# Patient Record
Sex: Female | Born: 1975 | Hispanic: Yes | Marital: Single
Health system: Southern US, Community
[De-identification: ages and names within clinical notes are randomized; demographics above are authoritative.]

---

## 2007-06-24 ENCOUNTER — Encounter: Payer: Self-pay | Admitting: Maternal & Fetal Medicine

## 2007-07-08 ENCOUNTER — Encounter: Payer: Self-pay | Admitting: Maternal & Fetal Medicine

## 2007-07-22 ENCOUNTER — Encounter: Payer: Self-pay | Admitting: Maternal & Fetal Medicine

## 2007-09-02 ENCOUNTER — Encounter: Payer: Self-pay | Admitting: Maternal & Fetal Medicine

## 2007-10-07 ENCOUNTER — Encounter: Payer: Self-pay | Admitting: Maternal & Fetal Medicine

## 2007-12-01 ENCOUNTER — Observation Stay: Payer: Self-pay

## 2007-12-16 ENCOUNTER — Inpatient Hospital Stay: Payer: Self-pay

## 2012-03-22 ENCOUNTER — Ambulatory Visit: Payer: Self-pay | Admitting: Family Medicine

## 2012-03-31 ENCOUNTER — Emergency Department: Payer: Self-pay | Admitting: *Deleted

## 2012-03-31 LAB — URINALYSIS, COMPLETE
Bacteria: NONE SEEN
Bilirubin,UR: NEGATIVE
Ketone: NEGATIVE
Ph: 7 (ref 4.5–8.0)
Protein: 30
RBC,UR: 68 /HPF (ref 0–5)
Specific Gravity: 1.003 (ref 1.003–1.030)
Squamous Epithelial: 1

## 2012-03-31 LAB — HCG, QUANTITATIVE, PREGNANCY: Beta Hcg, Quant.: 4525 m[IU]/mL — ABNORMAL HIGH

## 2012-03-31 LAB — CBC
HGB: 13.2 g/dL (ref 12.0–16.0)
MCH: 28.9 pg (ref 26.0–34.0)
MCHC: 33.7 g/dL (ref 32.0–36.0)
MCV: 86 fL (ref 80–100)
Platelet: 205 10*3/uL (ref 150–440)
RDW: 13.7 % (ref 11.5–14.5)

## 2012-04-03 LAB — PATHOLOGY REPORT

## 2012-06-20 ENCOUNTER — Encounter: Payer: Self-pay | Admitting: Maternal and Fetal Medicine

## 2012-07-04 ENCOUNTER — Encounter: Payer: Self-pay | Admitting: Obstetrics and Gynecology

## 2012-07-25 ENCOUNTER — Encounter: Payer: Self-pay | Admitting: Obstetrics & Gynecology

## 2012-08-29 ENCOUNTER — Encounter: Payer: Self-pay | Admitting: Obstetrics & Gynecology

## 2012-11-07 ENCOUNTER — Encounter: Payer: Self-pay | Admitting: Obstetrics and Gynecology

## 2013-01-29 ENCOUNTER — Inpatient Hospital Stay: Payer: Self-pay | Admitting: Obstetrics and Gynecology

## 2013-01-29 LAB — CBC WITH DIFFERENTIAL/PLATELET
Basophil #: 0 10*3/uL (ref 0.0–0.1)
Basophil %: 0.3 %
Eosinophil %: 0.2 %
Lymphocyte #: 1.3 10*3/uL (ref 1.0–3.6)
Lymphocyte %: 19.9 %
MCH: 28.9 pg (ref 26.0–34.0)
MCV: 84 fL (ref 80–100)
Monocyte #: 0.5 x10 3/mm (ref 0.2–0.9)
Monocyte %: 6.9 %
Neutrophil #: 4.9 10*3/uL (ref 1.4–6.5)
Platelet: 190 10*3/uL (ref 150–440)
RBC: 4.14 10*6/uL (ref 3.80–5.20)
RDW: 13.5 % (ref 11.5–14.5)
WBC: 6.8 10*3/uL (ref 3.6–11.0)

## 2013-01-30 LAB — CBC WITH DIFFERENTIAL/PLATELET
Basophil %: 0.2 %
Eosinophil %: 0.1 %
HCT: 33.8 % — ABNORMAL LOW (ref 35.0–47.0)
HGB: 11.8 g/dL — ABNORMAL LOW (ref 12.0–16.0)
Lymphocyte #: 1.4 10*3/uL (ref 1.0–3.6)
MCHC: 34.8 g/dL (ref 32.0–36.0)
MCV: 84 fL (ref 80–100)
Monocyte #: 0.7 x10 3/mm (ref 0.2–0.9)
Monocyte %: 6.6 %
Platelet: 199 10*3/uL (ref 150–440)

## 2013-01-30 LAB — HEMATOCRIT: HCT: 34.1 % — ABNORMAL LOW (ref 35.0–47.0)

## 2013-07-08 IMAGING — US US OB NUCHAL TRANSLUCENCY 1ST GEST - [facility] NRPT
1 series · 14 of 28 positions shown · non-contrast
Comparison: none

[Series 1: us ob nuchal translucency 1st gest - [facility] nrpt · 0.18mm/px · 14 of 47 slices shown]
[im 2/47]
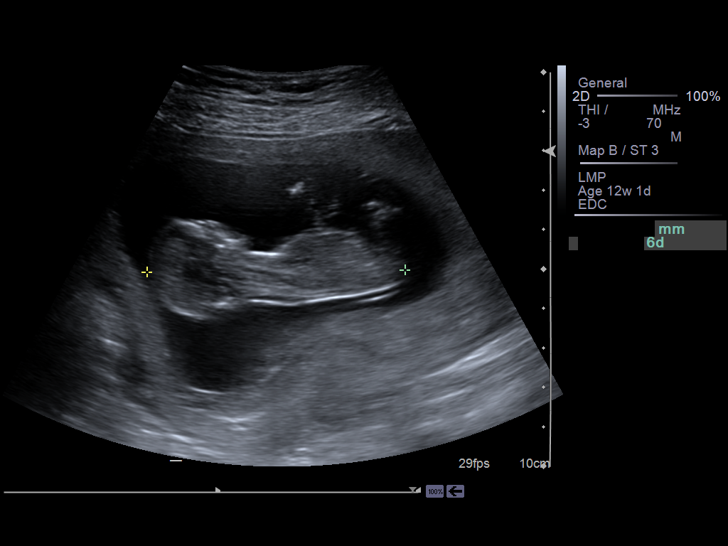
[im 6/47]
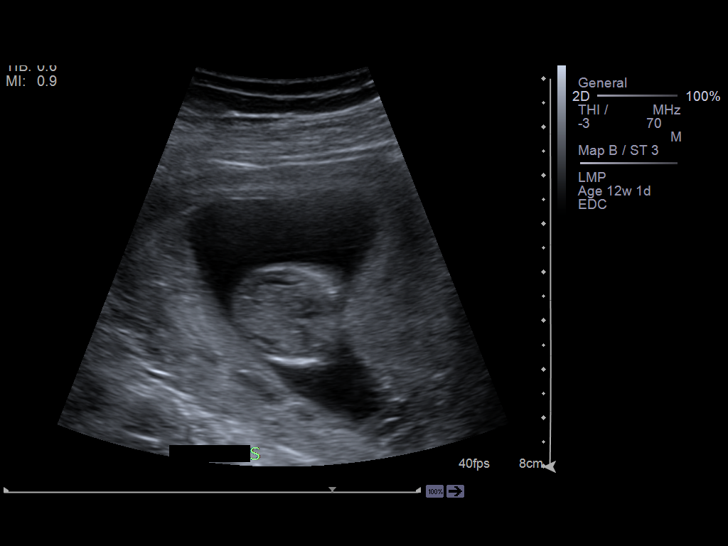
[im 9/47]
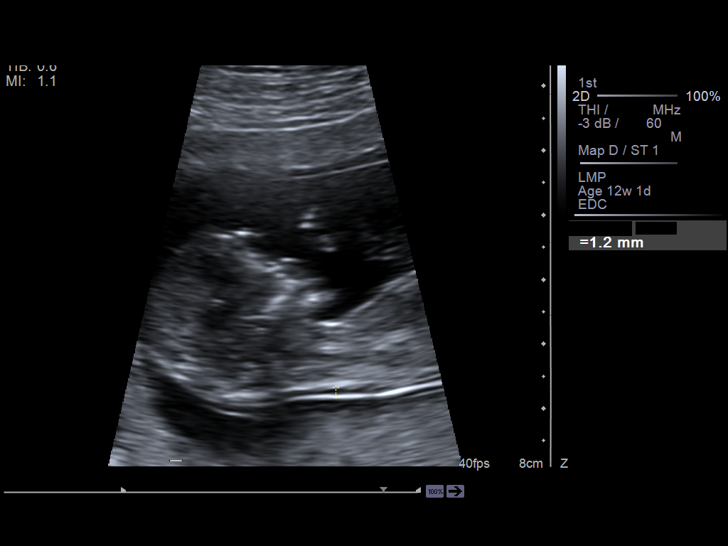
[im 12/47]
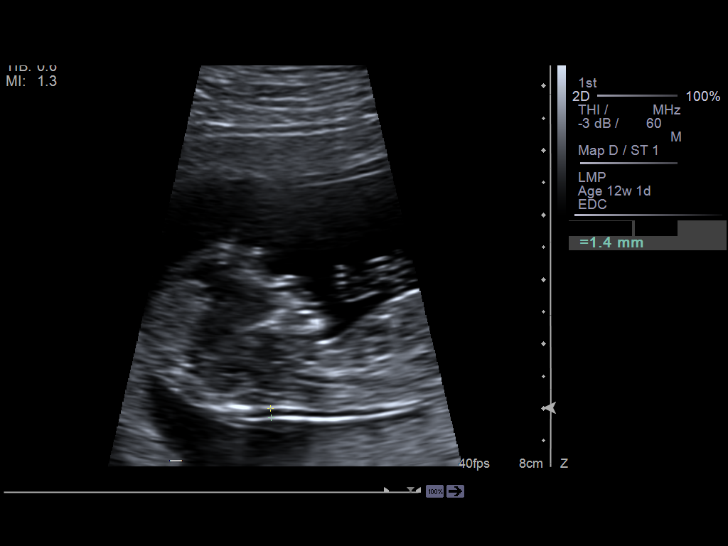
[im 16/47]
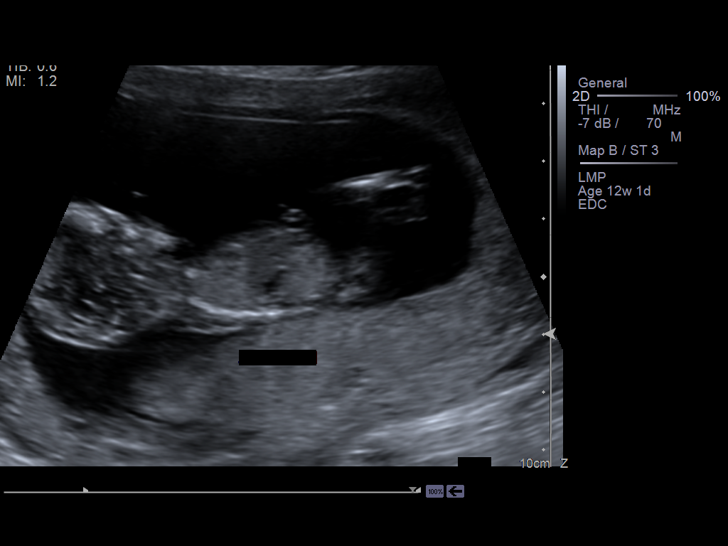
[im 19/47]
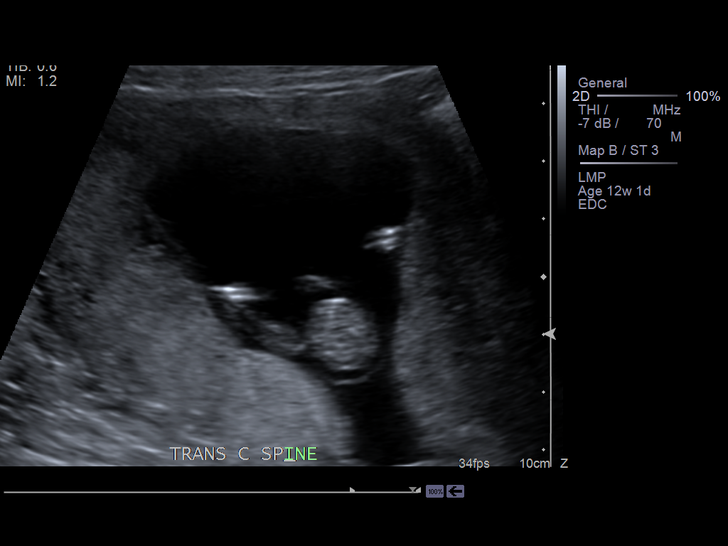
[im 23/47]
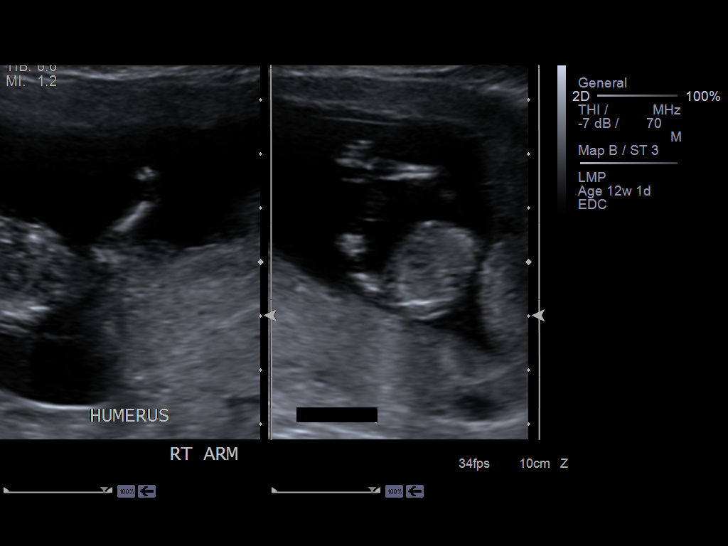
[im 26/47]
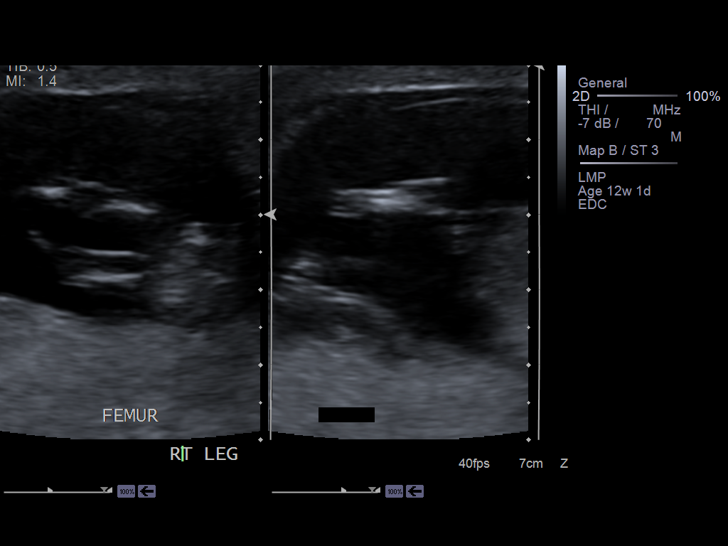
[im 29/47]
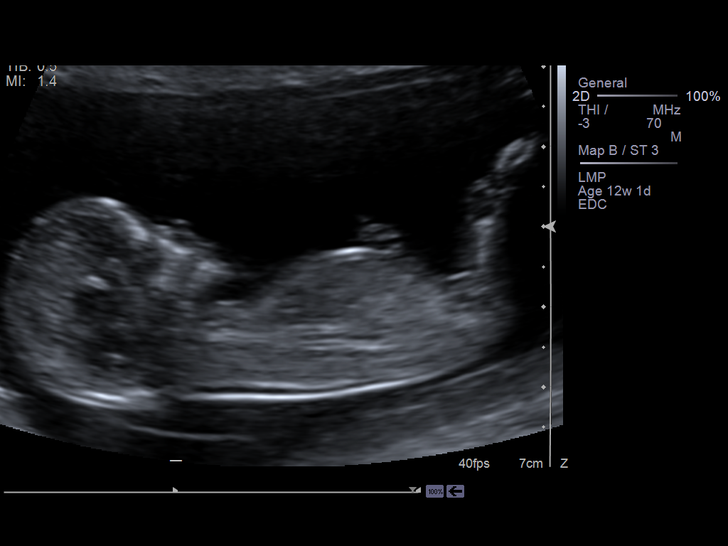
[im 33/47]
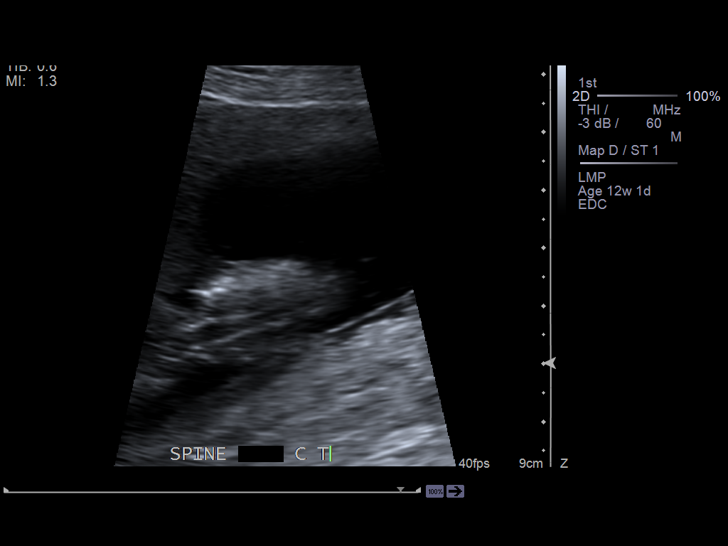
[im 36/47]
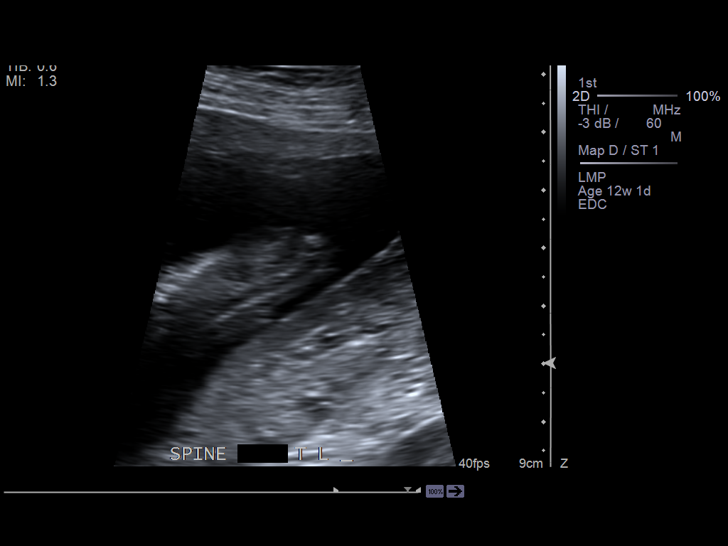
[im 40/47]
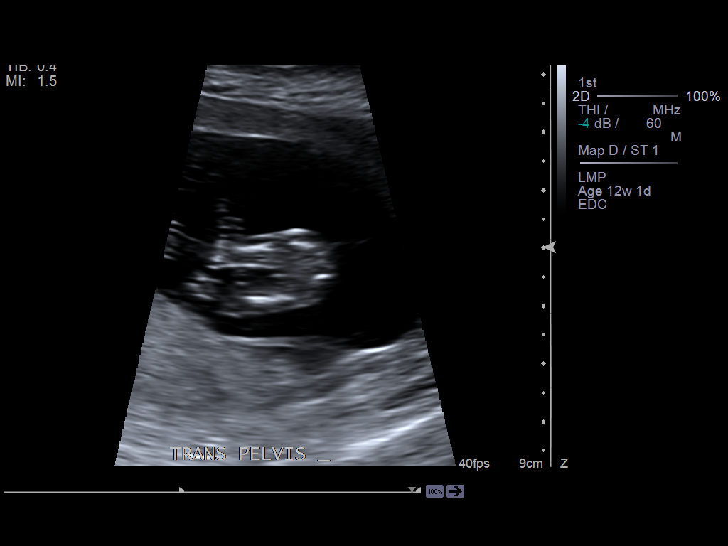
[im 43/47]
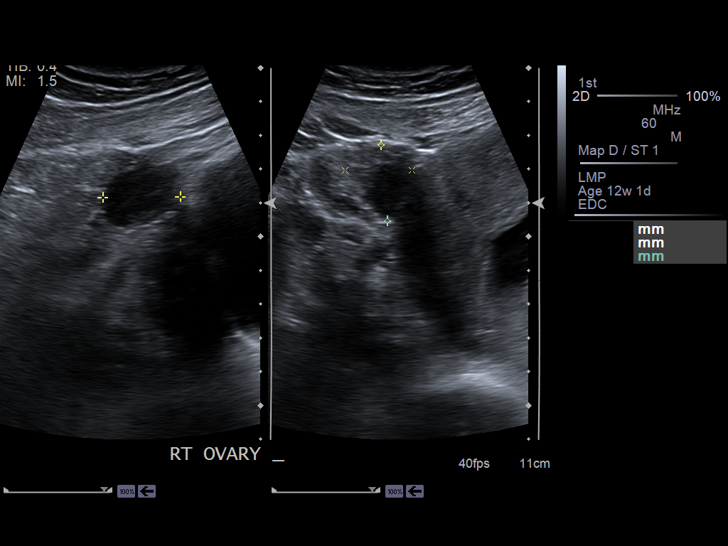
[im 47/47]
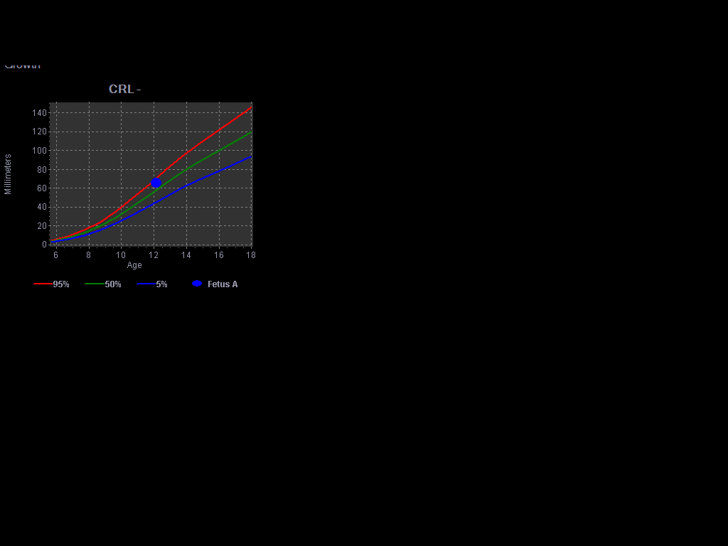

[14 of 28 positions shown; findings below may reference images not displayed]

IMAGES IMPORTED FROM THE [employer] WORKFLOW SYSTEM
NO DICTATION FOR STUDY

## 2013-08-12 IMAGING — US US OB DETAIL+14 WK - NRPT [facility]
1 series · 14 of 28 positions shown · non-contrast
Comparison: none

[Series 1: us ob detail+14 wk - nrpt [facility] · 0.23mm/px · 14 of 76 slices shown]
[im 3/76]
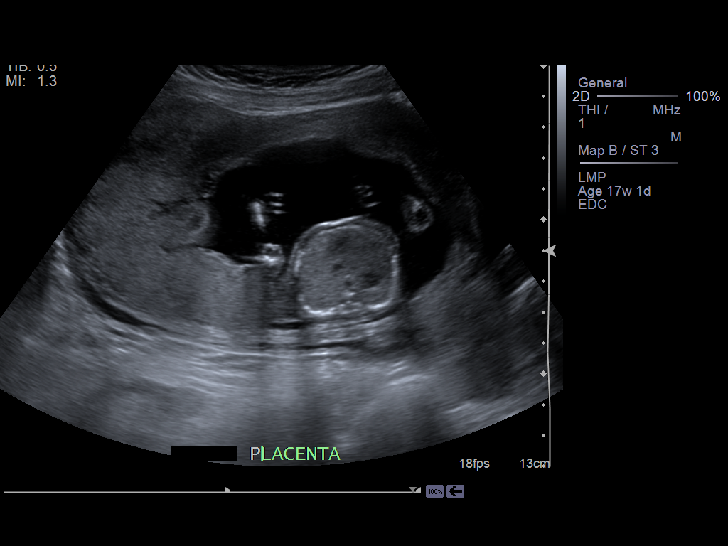
[im 9/76]
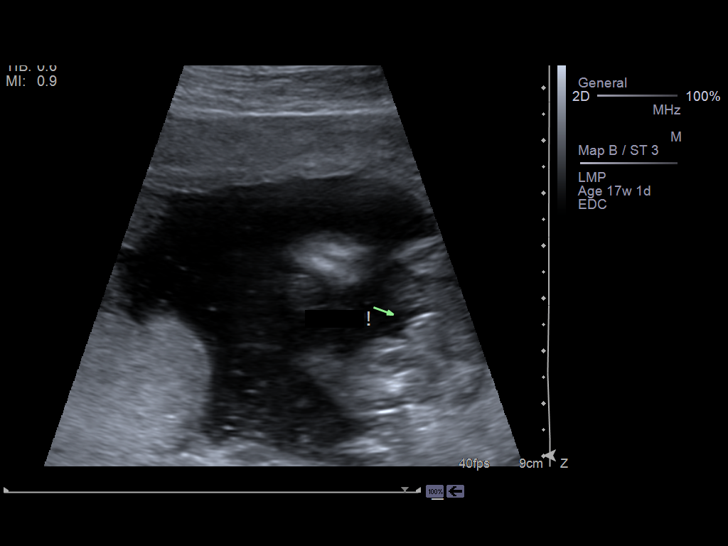
[im 14/76]
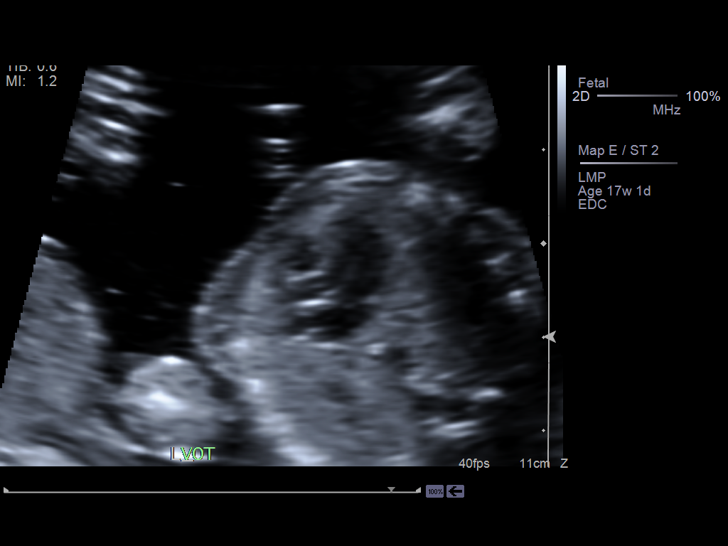
[im 20/76]
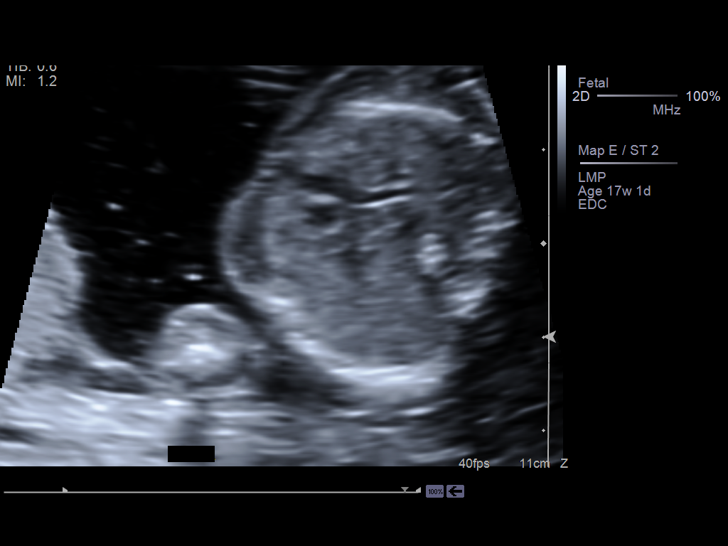
[im 26/76]
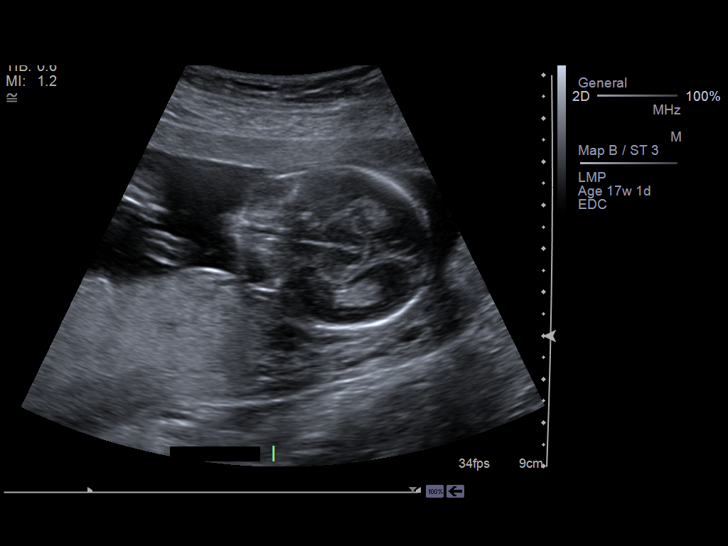
[im 31/76]
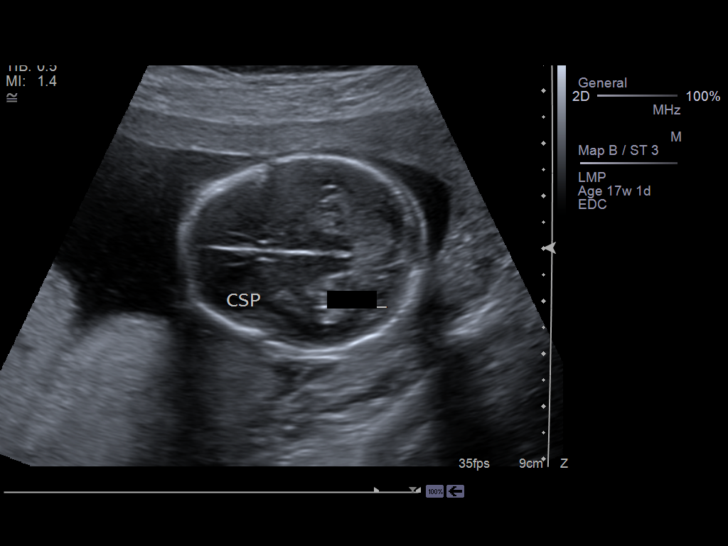
[im 37/76]
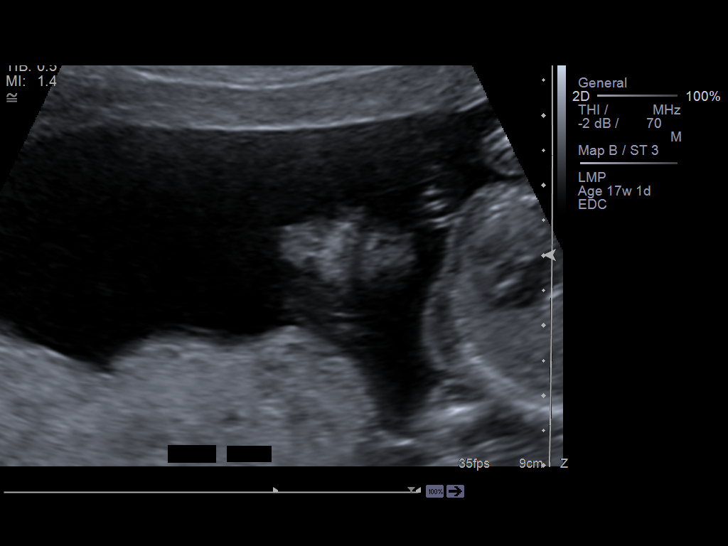
[im 42/76]
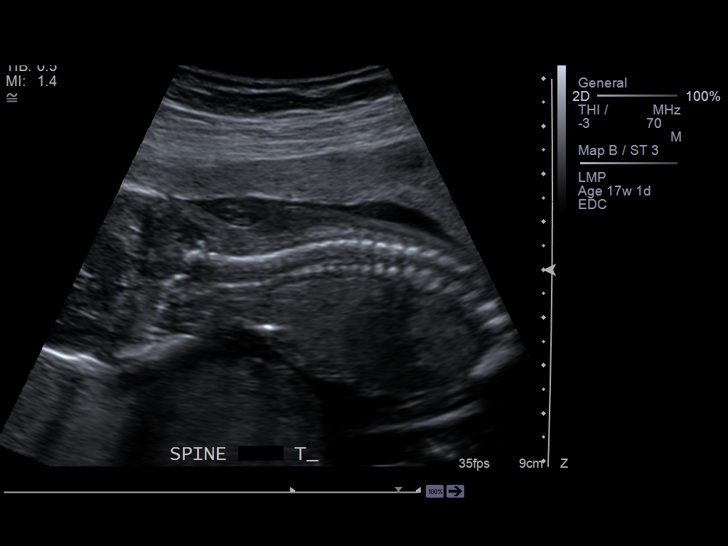
[im 48/76]
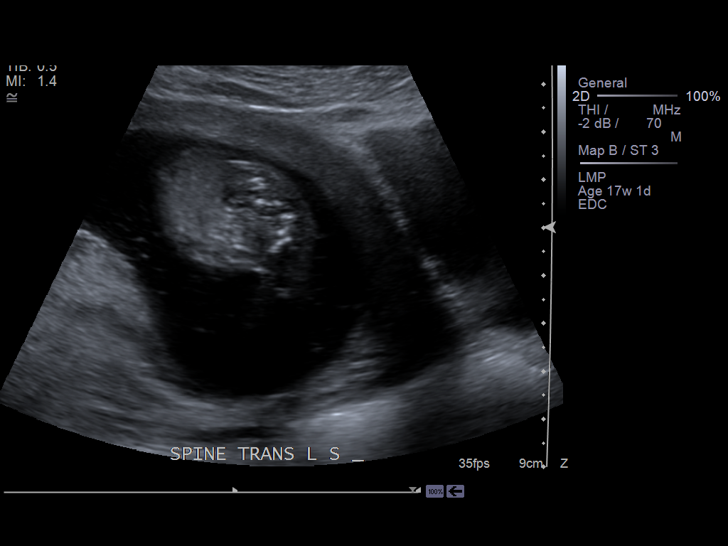
[im 53/76]
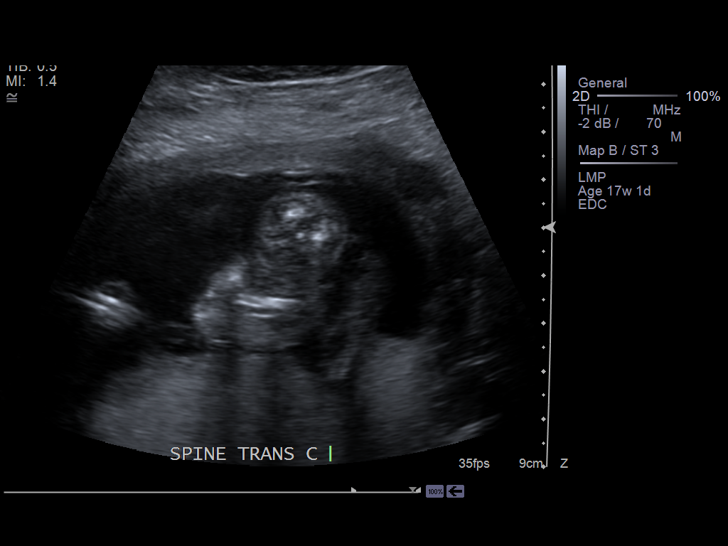
[im 59/76]
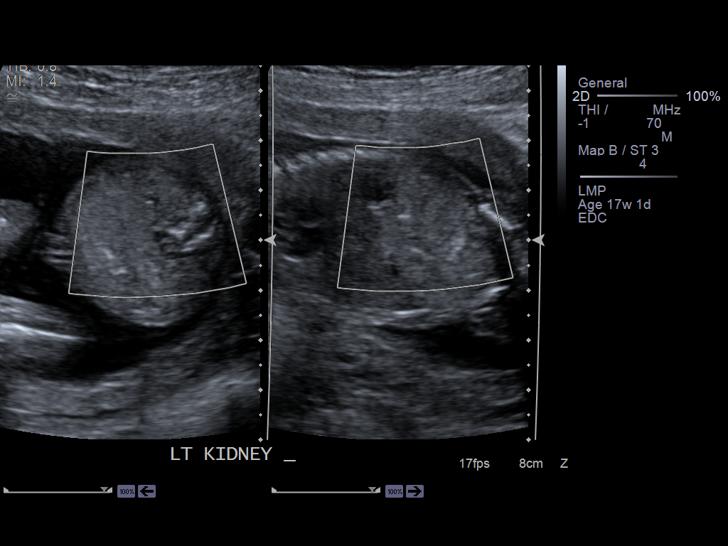
[im 64/76]
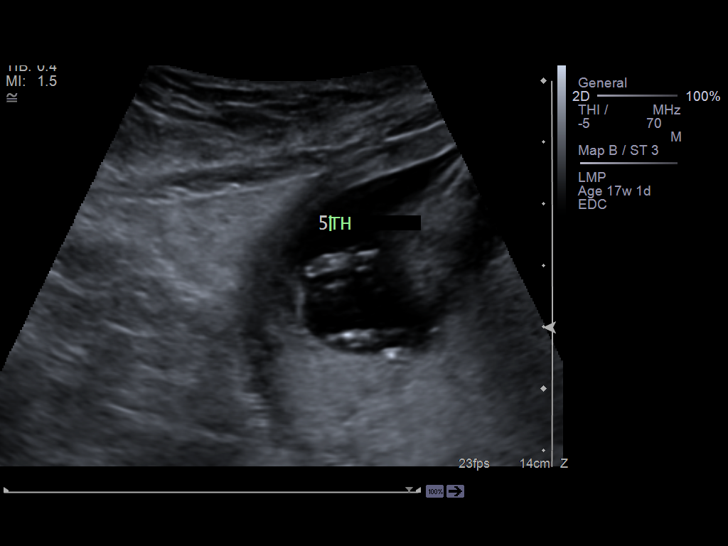
[im 70/76]
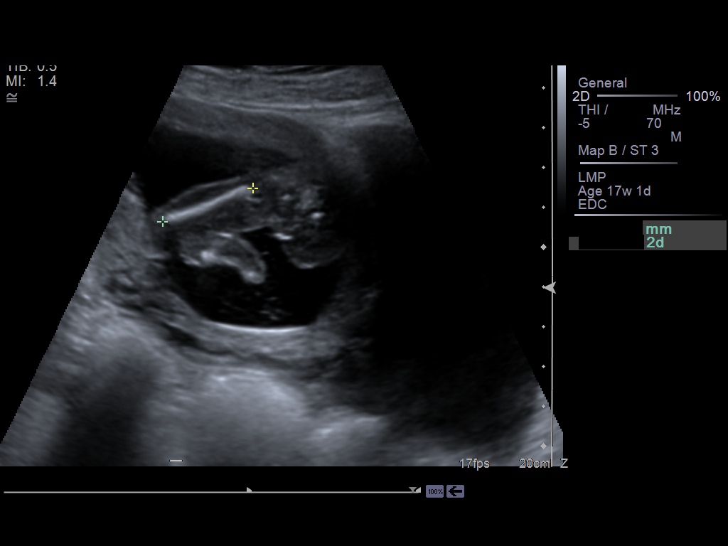
[im 76/76]
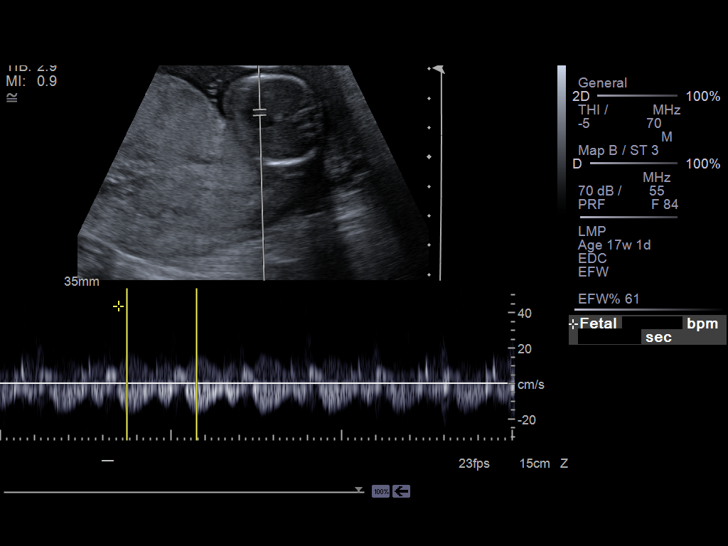

[14 of 28 positions shown; findings below may reference images not displayed]

IMAGES IMPORTED FROM THE SYNGO WORKFLOW SYSTEM
NO DICTATION FOR STUDY

## 2014-12-29 NOTE — Consult Note (Signed)
Referral Information:   Reason for Referral Question of antiphospholipid antibody syndrome    [facility]erring Physician [facility]    Prenatal Hx 39 year old [id number] who has not established prenatal care yet presents for evaluation of a possible antiphospholipid antibody syndrome. Her history is unclear. In 2008, during her 3rd pregnancy, the patient had a documented positive RPR at a titer of 1:4. There is mention that she had a negative TP-AP confirmatory test. We could not find a lab result. During that pregnancy, the patient [facility] cervical polyp. Within that consult there is a mention that the patient had a moderate positive IgM (54) anticardiolipin antibody. Again, were cannot fight that lab result. The patient states that [facility] There is no record in the [facility] system or the [place] system of the lab test. For that  pregnancy, the patient was placed on low dose aspirin    Past Obstetrical Hx G1 - 2000; First trimester SAb needing a D&C G2 - 2002; Uncomplicated full term vaginal delivery G3 - 2008; Full term vaginal delivery with complications noted above G4 - July 2013;  First trimester SAb needing a D&C G5 - current   Allergies:   No Known Allergies:   Vital Signs/Notes:  Nursing Vital Signs: **Vital Signs.:   10-Oct-13 13:02   Vital Signs Type Routine   Temperature Source oral   Pulse Pulse 102   Pulse source if not from Vital Sign Device dinamap   Respirations Respirations 14   Systolic BP Systolic BP 112   Diastolic BP (mmHg) Diastolic BP (mmHg) 73   Mean BP 86   BP Source  if not from Vital Sign Device dinamap   Impression/Recommendations:   Impression 1) Patient with unclear, but suspicious history for positive anticardiolipin antibody    Recommendations 1) Screen for antiphospholipid antibodies -- tests ordered today   a) RPR   b) Lupus anticoagulant screen   c)  Anticardiolipin antibody screen   d) Anti-beta 2 glycoprotein antiboday screen 2) RTC 2 weeks to discuss results 3) First trimester ultrasound in 2 weeks     Total Time Spent with Patient 15 minutes    >50% of visit spent in couseling/coordination of care yes    Office Use Only [address]  Level 1 (15min) NEW office consult prob focused   Coding Description: MATERNAL CONDITIONS/HISTORY INDICATION(S).   OTHER: Antiphospholipid antibodies.  Electronic Signatures: Marcelino ScotBrancazio, Saylah Ketner (MD)  (Signed 10-Oct-13 14:20)  Authored: Referral, Allergies, Vital Signs/Notes, Impression, Billing, Coding Description   Last Updated: 10-Oct-13 14:20 by Marcelino ScotBrancazio, Zoraida Havrilla (MD)

## 2014-12-29 NOTE — Consult Note (Signed)
Referral Information:   Reason for Referral Postive RPR and treponemal specific test , positive anticardiolipin IgM    [facility]ician [facility]    Prenatal Hx 39 year old [id number] at 9 weeks  [facility] her RPR was 1:8 , her treponemal specific test was Positive  on 06/20/12. The pt reports no past Rx for syphilis. She reports no h/o vulvar ulcer or rash. She is monogamous with one partner who is the father of her 37 yo. Her 39 yo was with another man. In 2008, during her 3rd pregnancy, the patient had a documented positive RPR at a titer of 1:4. There is mention that she had a negative TP-AP confirmatory test. We could not find a lab result. During that pregnancy, the patient was seen by [facility] for a cervical polyp. Within that consult there is a mention that the patient had a moderate positive IgM (54) anticardiolipin antibody.  For that  pregnancy, the patient was placed on low dose aspirin.    Past Obstetrical Hx G1 - 2000; First trimester SAb needing a D&C G2 - 2002; Uncomplicated full term vaginal delivery G3 - 2008; Full term vaginal delivery with complications noted above G4 - July 2013;  First trimester SAb needing a D&C G5 - current   Home Medications: Medication Instructions Status  multivitamin, prenatal   once a day  Active  Zyrtec 10 mg oral tablet 1 tab(s) orally once a day Active   Allergies:   No Known Allergies:   Vital Signs/Notes:  Nursing Vital Signs: **Vital Signs.:   24-Oct-13 09:09   Vital Signs Type Routine   Temperature Temperature (F) 98.2   Celsius 36.7   Temperature Source oral   Pulse Pulse 83   Pulse source if not from Vital Sign Device dinamap   Respirations Respirations 14   Systolic BP Systolic BP 102   Diastolic BP (mmHg) Diastolic BP (mmHg) 58   Mean BP 72   BP Source  if not from Vital Sign Device dinamap   Perinatal Consult:   LMP 01-May-2012    PGyn Hx  regular menses    PMed Hx Rubella Equivocal    Past Medical History cont'd pt reports no significant medical hisotry - recent symptoms of pregnancy- exhaustion, low back pain, occ puffiness occ itching    PSurg Hx neg    FHx mother vaginal cancer    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               [facility]            No: [id number]           [address], [place], [place] [address]           Mila Homer, MD         [phone]   Result(s) reported on 02 Jul 2012 at 07:35AM.  RPR (STS) ========== TEST NAME ==========  ========= RESULTS =========  = REFERENCE RANGE =  RPR  RPR RPR                             [   Reactive             ]      Non Reactive . RPR, Quant.                     [H  1:8                  ]        NonRea<1:1 .               [facility]            No: [id number]           [address] [place], [place] [address]           Mila Homer, MD         [phone]   Result(s) reported on 24 Jun 2012 at 06:48PM.   Antiphospholipid Syndrome ========== TEST NAME ==========  ========= RESULTS =========  = REFERENCE RANGE =  ANTIPHOSPHOLIPID SYN PAN  Antiphospholipid Syndrome Prof Anticardiolipin Ab,IgG,Qn       [   <9 GPL U/mL          ]              0-14              Negative:              <15                                          Indeterminate:     15 - 20                                          Low-Med Positive: >20 - 80                                          High Positive:         >80 Anticardiolipin  Ab,IgM,Qn       [H  41 MPL U/mL          ]              0-12                                          Negative:              <13                                          Indeterminate:     13 - 20    Low-Med Positive: >20 - 80  High Positive:         >80 PTT-LA                          [   37.6 sec             ]          0.0-50.0 dRVVT                           [   29.1 sec             ]          0.0-55.1 Lupus Reflex Interpretation     [   Comment:             ]                   No lupus anticoagulant was detected.               [facility]             No: [id number]           [address] [place], [place] [address]           Mila Homer, MD         [phone]   Result(s) reported on 24 Jun 2012 at 08:09AM.   Anticardiolipin ABS, IgA, IgG, IgM ========== TEST NAME ==========  ========= RESULTS =========  = REFERENCE RANGE =  ANTICARDIOLPN AB IGA,G,M  Anticardiolip Ab, IgA/G/M, Qn Anticardiolipin Ab,IgG,Qn       [   <9 GPL U/mL          ]              0-14             Negative:              <15                                          Indeterminate:     15 - 20                                          Low-Med Positive: >20 - 80                                          High Positive:         >80 Anticardiolipin Ab,IgM,Qn       [H  39 MPL U/mL          ]              0-12                                          Negative:              <13  Indeterminate:     13 - 20   Low-Med Positive: >20 - 80                                          High Positive:         >80 Anticardiolipin Ab,IgA,Qn       [   <9 APL U/mL          ]              0-11                                          Negative:              <12                                     Indeterminate:     12 - 20                                          Low-Med Positive: >20 - 80                                          High  Positive:         >80               [facility]            No: [id number]           [address], [place], [place] [address]           Mila HomerWilliam F Hancock, MD         [phone]   Result(s) reported on 24 Jun 2012 at 06:48PM.   Beta 2 Glycoprotein ========== TEST NAME ==========  ========= RESULTS =========  = REFERENCE RANGE =  BETA2 GLYCOPROTEIN  Beta-2 Glycoprotein I Ab,G,A,M Beta-2 Glycoprotein I Ab, IgG   [   <9 GPI IgG units     ]              0-20 The reference interval reflects a3SD or 99th percentile interval, which is thought to represent a potentially clinically significant result in accordance with the International Consensus Statement on the classification criteria for definitive antiphospholipid syndrome (APS). J Thromb Haem 2006;4:295-306. Beta-2 Glycoprotein I Ab, IgA   [   <9 GPI IgA units     ]              0-25 The reference interval reflects a 3SD or 99th percentile interval, which is thought to represent a potentially clinically significant result inaccordance with the International Consensus Statement on the classification criteria for definitive antiphospholipid syndrome (APS). J Thromb Haem 2006;4:295-306. Beta-2 Glycoprotein I Ab, IgM   [   15 GPI IgM units     ]              0-32 The reference interval reflects a 3SD or 99th percentile interval, which is thought to represent  a potentially clinically significant result in accordance with the International Consensus Statement on the classification criteria for definitive antiphospholipid syndrome (APS). J Thromb Haem 2006;4:295-306.               [facility]            No: [id number]           [address] [place], [place] [address]           Mila Homer, MD         [phone]   Result(s) reported on 24 Jun 2012 at 06:48PM.     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 The pt will likely go to [facility] firs tRx and then [facility] for next 2 to expedite treatment. I told her this is a reportable disease. 2) positive anticardiolipin x2 IGM low-med pos - h/o sab x2 no other h/o sxs suspicious for autoimmune disease  3) maternal age 50 at delviery    Recommendations 1- I recommended to the pt that she receive benzanthine PCN G 2.4 million units weekly x3 - 2 I recommended her husband be tested for syphilis 3-I recommended She have repeat RPR for titer q trimester and around delivery - re treat if titer increases . Ireviewed with her the notion of becoming "serofast"that her RPR may not ever completely revert . I told her that her treponemal test will always be positive but does not suggest protective antibody.  4 In general unless a medically complicated pt - I do not refer for LP for CSF testing 5 - I suggested she take a baby aspirin due to pos ACL 6 I ordered an anatomy scan - pt will nto be 37 - I did not discuss down screening tests - we are happy to see her back sooner if she desires a first tri screen.   Plan:   Genetic Counseling no    Prenatal Diagnosis Options Level II [place], first tri screen if desired    Delivery Mode Vaginal    Comment/Plan RPR q trimester     Total Time Spent with Patient 30 minutes    >50% of visit spent in couseling/coordination of care yes    Office Use Only [id number]  Office Visit Level 4 ( ) EST detailed office/outpt   Coding Description: MATERNAL CONDITIONS/HISTORY INDICATION(S).   Anitphospholipid syndrome.   OTHER: Antiphospholipid antibodies, syphillis.  Electronic Signatures: Rondall Allegra (MD)  (Signed 24-Oct-13 12:26)  Authored: Referral, Home Medications,  Allergies, Vital Signs/Notes, Consult, Exam, Lab, Lab/Radiology Notes, Impression, Plan, Billing, Coding Description   Last Updated: 24-Oct-13 12:26 by Rondall Allegra (MD)

## 2015-01-19 NOTE — H&P (Signed)
L&D Evaluation:  History:  HPI 39 y/o [id number] @ 39wks EDC 02/05/13. IOL due to + Anticardiolipin antibodies per [facility]land Kitchen[facility]. Denies uc's, leaking fluid or vaginal bleeding, baby is active.HX 1st trimester loss, abnormal fetus with cranial defect. Well pregnancy, GBS unknown   Presents with IOL   Patient's Medical History No Chronic Illness   Patient's Surgical History none   Medications Pre Natal Vitamins   Allergies NKDA   Social History none   Family History Non-Contributory   ROS:  ROS All systems were reviewed.  HEENT, CNS, GI, GU, Respiratory, CV, Renal and Musculoskeletal systems were found to be normal.   Exam:  Vital Signs stable   Urine Protein not completed   General no apparent distress   Mental Status clear   Chest clear   Heart normal sinus rhythm   Abdomen gravid, non-tender   Estimated Fetal Weight Average for gestational age   Fetal Position vtx   Fundal Height term   Back no CVAT   Edema no edema   Reflexes 1+   Pelvic no external lesions, 3cm 50% cx posterior vtx @ 0 station nl show BOWI   Mebranes Intact   FHT normal rate with no decels, baseline 150's 160's avg variability with accels   Fetal Heart Rate 150   Ucx irregular, mild irritability   Skin dry   Lymph no lymphadenopathy   Impression:  Impression IOL @ 39wks +Anticardiolipin antibodies   Plan:  Plan monitor contractions and for cervical change   Comments Admitted, explained what to expect with IOL (other pregnancies induced). DC pain management options available, may request epidural. [facility], will begin ABX as necessary. Interpreter present. Partner at bedside supportive.   Electronic Signatures: Albertina ParrLugiano, Keshonna Valvo B (CNM)  (Signed 21-May-14 09:09)  Authored: L&D Evaluation   Last Updated: 21-May-14 09:09 by Albertina ParrLugiano, Eber Ferrufino B (CNM)

## 2019-09-29 ENCOUNTER — Other Ambulatory Visit: Payer: Self-pay

## 2019-09-29 ENCOUNTER — Encounter: Payer: Self-pay | Admitting: Emergency Medicine

## 2019-09-29 ENCOUNTER — Emergency Department
Admission: EM | Admit: 2019-09-29 | Discharge: 2019-09-29 | Disposition: A | Discharge status: Home / Self Care | Payer: Self-pay | Attending: Emergency Medicine | Admitting: Emergency Medicine

## 2019-09-29 DIAGNOSIS — Y999 Unspecified external cause status: Secondary | ICD-10-CM | POA: Insufficient documentation

## 2019-09-29 DIAGNOSIS — S161XXA Strain of muscle, fascia and tendon at neck level, initial encounter: Secondary | ICD-10-CM | POA: Insufficient documentation

## 2019-09-29 DIAGNOSIS — Y92410 Unspecified street and highway as the place of occurrence of the external cause: Secondary | ICD-10-CM | POA: Insufficient documentation

## 2019-09-29 DIAGNOSIS — Y9389 Activity, other specified: Secondary | ICD-10-CM | POA: Insufficient documentation

## 2019-09-29 MED ORDER — IBUPROFEN 600 MG PO TABS: 600.0000 mg | ORAL_TABLET | Freq: Three times a day (TID) | ORAL | 0 refills | Status: AC | PRN

## 2019-09-29 MED ORDER — CYCLOBENZAPRINE HCL 10 MG PO TABS: 10.0000 mg | ORAL_TABLET | Freq: Three times a day (TID) | ORAL | 0 refills | Status: AC | PRN
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
## 2019-09-29 NOTE — Discharge Instructions (Signed)
Discharge care instruction take medication as directed.  Be advised muscle relaxer may cause drowsiness.  Do not operate vehicles while taking this medication.

## 2019-10-14 ENCOUNTER — Ambulatory Visit (HOSPITAL_COMMUNITY): Admission: EM | Admit: 2019-10-14 | Discharge: 2019-10-14 | Discharge status: Against Medical Advice | Payer: Self-pay
